# Patient Record
Sex: Male | Born: 1980 | Hispanic: No | Smoking: Current every day smoker
Health system: Southern US, Community
[De-identification: ages and names within clinical notes are randomized; demographics above are authoritative.]

---

## 2001-03-17 ENCOUNTER — Encounter: Payer: Self-pay | Admitting: Emergency Medicine

## 2001-03-17 ENCOUNTER — Emergency Department (HOSPITAL_COMMUNITY): Admission: EM | Admit: 2001-03-17 | Discharge: 2001-03-17 | Payer: Self-pay | Admitting: Emergency Medicine

## 2016-01-29 ENCOUNTER — Encounter (HOSPITAL_COMMUNITY): Payer: Self-pay | Admitting: Emergency Medicine

## 2016-01-29 ENCOUNTER — Ambulatory Visit (HOSPITAL_COMMUNITY)
Admission: EM | Admit: 2016-01-29 | Discharge: 2016-01-29 | Disposition: A | Discharge status: Home / Self Care | Payer: Self-pay | Attending: Family Medicine | Admitting: Family Medicine

## 2016-01-29 ENCOUNTER — Ambulatory Visit (INDEPENDENT_AMBULATORY_CARE_PROVIDER_SITE_OTHER): Payer: Self-pay

## 2016-01-29 DIAGNOSIS — S40211A Abrasion of right shoulder, initial encounter: Secondary | ICD-10-CM

## 2016-01-29 MED ORDER — DICLOFENAC POTASSIUM 50 MG PO TABS: 50.0000 mg | ORAL_TABLET | Freq: Three times a day (TID) | ORAL | Status: AC
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
## 2020-11-09 ENCOUNTER — Encounter (HOSPITAL_COMMUNITY): Payer: Self-pay

## 2020-11-09 ENCOUNTER — Emergency Department (HOSPITAL_COMMUNITY): Payer: Self-pay

## 2020-11-09 ENCOUNTER — Emergency Department (HOSPITAL_COMMUNITY)
Admission: EM | Admit: 2020-11-09 | Discharge: 2020-11-09 | Disposition: A | Discharge status: Home / Self Care | Payer: Self-pay | Attending: Emergency Medicine | Admitting: Emergency Medicine

## 2020-11-09 DIAGNOSIS — Z20822 Contact with and (suspected) exposure to covid-19: Secondary | ICD-10-CM | POA: Insufficient documentation

## 2020-11-09 DIAGNOSIS — Z23 Encounter for immunization: Secondary | ICD-10-CM | POA: Insufficient documentation

## 2020-11-09 DIAGNOSIS — M25559 Pain in unspecified hip: Secondary | ICD-10-CM | POA: Insufficient documentation

## 2020-11-09 DIAGNOSIS — S31109A Unspecified open wound of abdominal wall, unspecified quadrant without penetration into peritoneal cavity, initial encounter: Secondary | ICD-10-CM | POA: Insufficient documentation

## 2020-11-09 DIAGNOSIS — W3400XA Accidental discharge from unspecified firearms or gun, initial encounter: Secondary | ICD-10-CM | POA: Insufficient documentation

## 2020-11-09 DIAGNOSIS — T1490XA Injury, unspecified, initial encounter: Secondary | ICD-10-CM

## 2020-11-09 LAB — COMPREHENSIVE METABOLIC PANEL
ALT: 33 U/L (ref 0–44)
AST: 28 U/L (ref 15–41)
Albumin: 3.6 g/dL (ref 3.5–5.0)
Alkaline Phosphatase: 67 U/L (ref 38–126)
Anion gap: 10 (ref 5–15)
BUN: 13 mg/dL (ref 6–20)
CO2: 23 mmol/L (ref 22–32)
Calcium: 8.9 mg/dL (ref 8.9–10.3)
Chloride: 104 mmol/L (ref 98–111)
Creatinine, Ser: 1.05 mg/dL (ref 0.61–1.24)
GFR, Estimated: >60 mL/min (ref >60)
Glucose, Bld: 149 mg/dL — ABNORMAL HIGH (ref 70–99)
Potassium: 3.5 mmol/L (ref 3.5–5.1)
Sodium: 137 mmol/L (ref 135–145)
Total Bilirubin: 0.3 mg/dL (ref 0.3–1.2)
Total Protein: 6.7 g/dL (ref 6.5–8.1)

## 2020-11-09 LAB — I-STAT CHEM 8, ED
BUN: 14 mg/dL (ref 6–20)
Calcium, Ion: 1.05 mmol/L — ABNORMAL LOW (ref 1.15–1.40)
Chloride: 103 mmol/L (ref 98–111)
Creatinine, Ser: 1 mg/dL (ref 0.61–1.24)
Glucose, Bld: 148 mg/dL — ABNORMAL HIGH (ref 70–99)
HCT: 47 % (ref 39.0–52.0)
Hemoglobin: 16 g/dL (ref 13.0–17.0)
Potassium: 3.4 mmol/L — ABNORMAL LOW (ref 3.5–5.1)
Sodium: 140 mmol/L (ref 135–145)
TCO2: 24 mmol/L (ref 22–32)

## 2020-11-09 LAB — PROTIME-INR
INR: 1 (ref 0.8–1.2)
Prothrombin Time: 13.1 seconds (ref 11.4–15.2)

## 2020-11-09 LAB — CBC
HCT: 48.1 % (ref 39.0–52.0)
Hemoglobin: 15.7 g/dL (ref 13.0–17.0)
MCH: 29.2 pg (ref 26.0–34.0)
MCHC: 32.6 g/dL (ref 30.0–36.0)
MCV: 89.6 fL (ref 80.0–100.0)
Platelets: 220 10*3/uL (ref 150–400)
RBC: 5.37 MIL/uL (ref 4.22–5.81)
RDW: 12.5 % (ref 11.5–15.5)
WBC: 13.6 10*3/uL — ABNORMAL HIGH (ref 4.0–10.5)
nRBC: 0 % (ref 0.0–0.2)

## 2020-11-09 LAB — RESP PANEL BY RT-PCR (FLU A&B, COVID) ARPGX2
Influenza A by PCR: NEGATIVE
Influenza B by PCR: NEGATIVE
SARS Coronavirus 2 by RT PCR: NEGATIVE

## 2020-11-09 LAB — SAMPLE TO BLOOD BANK

## 2020-11-09 LAB — ETHANOL: Alcohol, Ethyl (B): <10 mg/dL (ref <10)

## 2020-11-09 MED ORDER — CEFAZOLIN SODIUM-DEXTROSE 2-4 GM/100ML-% IV SOLN
2.0000 g | Freq: Once | INTRAVENOUS | Status: AC
  Administered 2020-11-09: 2 g via INTRAVENOUS
  Filled 2020-11-09: qty 100

## 2020-11-09 MED ORDER — TETANUS-DIPHTH-ACELL PERTUSSIS 5-2.5-18.5 LF-MCG/0.5 IM SUSY
0.5000 mL | PREFILLED_SYRINGE | Freq: Once | INTRAMUSCULAR | Status: AC
  Administered 2020-11-09: 0.5 mL via INTRAMUSCULAR

## 2020-11-09 MED ORDER — IOHEXOL 300 MG/ML  SOLN
100.0000 mL | Freq: Once | INTRAMUSCULAR | Status: AC | PRN
  Administered 2020-11-09: 100 mL via INTRAVENOUS

## 2020-11-09 MED ORDER — ACETAMINOPHEN 500 MG PO TABS: 1000.0000 mg | ORAL_TABLET | Freq: Four times a day (QID) | ORAL | 0 refills | Status: AC | PRN

## 2020-11-09 MED ORDER — IBUPROFEN 800 MG PO TABS: 800.0000 mg | ORAL_TABLET | Freq: Three times a day (TID) | ORAL | 0 refills | Status: AC
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
